# Patient Record
Sex: Male | Born: 1995 | Race: Black or African American | Hispanic: No | Marital: Single | Smoking: Current every day smoker
Health system: Southern US, Community
[De-identification: ages and names within clinical notes are randomized; demographics above are authoritative.]

---
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
## 2018-09-07 ENCOUNTER — Other Ambulatory Visit: Payer: Self-pay

## 2018-09-07 ENCOUNTER — Encounter (HOSPITAL_COMMUNITY): Payer: Self-pay | Admitting: Emergency Medicine

## 2018-09-07 ENCOUNTER — Emergency Department (HOSPITAL_COMMUNITY)
Admission: EM | Admit: 2018-09-07 | Discharge: 2018-09-07 | Disposition: A | Discharge status: Home / Self Care | Payer: No Typology Code available for payment source | Attending: Emergency Medicine | Admitting: Emergency Medicine

## 2018-09-07 ENCOUNTER — Emergency Department (HOSPITAL_COMMUNITY): Payer: No Typology Code available for payment source

## 2018-09-07 DIAGNOSIS — S199XXA Unspecified injury of neck, initial encounter: Secondary | ICD-10-CM

## 2018-09-07 DIAGNOSIS — S0990XA Unspecified injury of head, initial encounter: Secondary | ICD-10-CM | POA: Diagnosis not present

## 2018-09-07 DIAGNOSIS — Y9389 Activity, other specified: Secondary | ICD-10-CM | POA: Insufficient documentation

## 2018-09-07 DIAGNOSIS — Y999 Unspecified external cause status: Secondary | ICD-10-CM | POA: Diagnosis not present

## 2018-09-07 DIAGNOSIS — Y92410 Unspecified street and highway as the place of occurrence of the external cause: Secondary | ICD-10-CM | POA: Insufficient documentation
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
## 2018-09-07 NOTE — Discharge Instructions (Addendum)
The CT images did not show any serious problems.  You may continue to have some soreness of for a few days, which should improve by use of heat on the sore areas and taking Tylenol or Motrin.  Return here, if needed, for problems.
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
## 2018-09-15 ENCOUNTER — Encounter (HOSPITAL_COMMUNITY): Payer: Self-pay | Admitting: Emergency Medicine

## 2018-09-15 ENCOUNTER — Other Ambulatory Visit: Payer: Self-pay

## 2018-09-15 ENCOUNTER — Ambulatory Visit (HOSPITAL_COMMUNITY)
Admission: EM | Admit: 2018-09-15 | Discharge: 2018-09-15 | Disposition: A | Discharge status: Home / Self Care | Payer: Self-pay | Attending: Internal Medicine | Admitting: Internal Medicine

## 2018-09-15 DIAGNOSIS — F17210 Nicotine dependence, cigarettes, uncomplicated: Secondary | ICD-10-CM | POA: Insufficient documentation

## 2018-09-15 DIAGNOSIS — Z202 Contact with and (suspected) exposure to infections with a predominantly sexual mode of transmission: Secondary | ICD-10-CM | POA: Insufficient documentation

## 2018-09-15 NOTE — ED Triage Notes (Signed)
Pt states his significant other informed him 2-3 days ago that they tested positive for herpes.  Pt is here for testing.  He is not having any symptoms.

## 2018-09-15 NOTE — ED Provider Notes (Signed)
[facility]    CSN: [id number] Arrival date & time: 09/15/18  1358     History   Chief Complaint Chief Complaint  Patient presents with  . Exposure to STD    HPI Alan Warren is a 22 y.o. male.   Who present today requesting STD testing done due to his prior sexual partner testing + for herpes via blood but she did not tell him which type. He denies having any penile lesions of penile discharge of dysuria.      History reviewed. No pertinent past medical history.  There are no active problems to display for this patient.   History reviewed. No pertinent surgical history.   Home Medications    Prior to Admission medications   Not on File    Family History History reviewed. No pertinent family history.  Social History Social History   Tobacco Use  . Smoking status: Current Every Day Smoker    Packs/day: 0.25  . Smokeless tobacco: Never Used  Substance Use Topics  . Alcohol use: No  . Drug use: Not Currently     Allergies   Patient has no known allergies.   Review of Systems Review of Systems  Genitourinary: Negative for discharge, dysuria, genital sores, penile pain, penile swelling, scrotal swelling and urgency.  Skin: Negative for rash.     Physical Exam Triage Vital Signs ED Triage Vitals  Enc Vitals Group     BP 09/15/18 1528 128/78     Pulse Rate 09/15/18 1528 72     Resp --      Temp 09/15/18 1528 98.2 F (36.8 C)     Temp Source 09/15/18 1528 Oral     SpO2 09/15/18 1528 100 %     Weight --      Height --      Head Circumference --      Peak Flow --      Pain Score 09/15/18 1529 0     Pain Loc --      Pain Edu? --      Excl. in GC? --    No data found.  Updated Vital Signs BP 128/78 (BP Location: Right Arm)   Pulse 72   Temp 98.2 F (36.8 C) (Oral)   SpO2 100%   Visual Acuity Right Eye Distance:   Left Eye Distance:   Bilateral Distance:    Right Eye Near:   Left Eye Near:    Bilateral Near:      Physical Exam  Constitutional: He appears well-developed and well-nourished. No distress.  HENT:  Head: Normocephalic.  Right Ear: External ear normal.  Left Ear: External ear normal.  Nose: Nose normal.  Eyes: Conjunctivae are normal. No scleral icterus.  Neck: Neck supple.  Pulmonary/Chest: Effort normal.  Genitourinary: Penis normal.  Genitourinary Comments: He did not want his male friend to leave the room during his exam, instead of me bringing a chaperone. He was not exposed to his friend during the exam   Neurological: He is alert.  Skin: Skin is warm and dry. No rash noted. He is not diaphoretic.  Psychiatric: He has a normal mood and affect. His behavior is normal. Thought content normal.  Nursing note and vitals reviewed.    UC Treatments / Results  Labs (all labs ordered are listed, but only abnormal results are displayed) Labs Reviewed  HSV(HERPES SIMPLEX VRS) I + II AB-IGG  RPR  HIV ANTIBODY (ROUTINE TESTING W REFLEX)  URINE CYTOLOGY ANCILLARY ONLY  EKG None  Radiology No results found.  Procedures Procedures (including critical care time)  Medications Ordered in UC Medications - No data to display  Initial Impression / Assessment and Plan / UC Course  I have reviewed the triage vital signs and the nursing notes. STD panel ordered.  He was explained about blood Herpes testing and what it means. Asked to sign up with mychart so we can inform him about his results when back.  He has read about herpes already and does not have any questions      Final Clinical Impressions(s) / UC Diagnoses   Final diagnoses:  STD exposure   Discharge Instructions   None    ED Prescriptions    None     Controlled Substance Prescriptions [place] Controlled Substance Registry consulted?    Garey HamRodriguez-Southworth, Roisin Mones, PA-C 09/15/18 1601

## 2018-09-16 LAB — RPR: RPR Ser Ql: NONREACTIVE

## 2018-09-16 LAB — URINE CYTOLOGY ANCILLARY ONLY
Chlamydia: NEGATIVE
NEISSERIA GONORRHEA: NEGATIVE
TRICH (WINDOWPATH): NEGATIVE

## 2018-09-16 LAB — HIV ANTIBODY (ROUTINE TESTING W REFLEX): HIV Screen 4th Generation wRfx: NONREACTIVE

## 2018-09-16 LAB — HSV(HERPES SIMPLEX VRS) I + II AB-IGG: HSV 2 Glycoprotein G Ab, IgG: <0.91 index (ref 0.00–0.90)

## 2018-10-02 ENCOUNTER — Other Ambulatory Visit: Payer: Self-pay

## 2018-10-02 ENCOUNTER — Ambulatory Visit (HOSPITAL_COMMUNITY)
Admission: EM | Admit: 2018-10-02 | Discharge: 2018-10-02 | Disposition: A | Discharge status: Home / Self Care | Payer: Self-pay | Attending: Family Medicine | Admitting: Family Medicine

## 2018-10-02 ENCOUNTER — Encounter (HOSPITAL_COMMUNITY): Payer: Self-pay

## 2018-10-02 DIAGNOSIS — Z113 Encounter for screening for infections with a predominantly sexual mode of transmission: Secondary | ICD-10-CM

## 2018-10-02 DIAGNOSIS — F17210 Nicotine dependence, cigarettes, uncomplicated: Secondary | ICD-10-CM | POA: Insufficient documentation

## 2018-10-02 NOTE — ED Triage Notes (Signed)
Pt cc wants to be tested for STD's .

## 2018-10-02 NOTE — Discharge Instructions (Addendum)
We are testing you again today for STDs We will call you with any positive results

## 2018-10-02 NOTE — ED Provider Notes (Signed)
[facility]    CSN: [id number] Arrival date & time: 10/02/18  1620     History   Chief Complaint Chief Complaint  Patient presents with  . SEXUALLY TRANSMITTED DISEASE    HPI Carin Hockraves Standley is a 22 y.o. male.   Patient is a 22 year old male presents for STD screening.  He was here on 09/15/2018 where he was tested and all results were negative.  Reports that he has been reexposed and would like be tested again.  He denies any current symptoms. No penile pain, lesions, dysuria or penile discharge.   ROS per HPI      History reviewed. No pertinent past medical history.  There are no active problems to display for this patient.   History reviewed. No pertinent surgical history.     Home Medications    Prior to Admission medications   Not on File    Family History History reviewed. No pertinent family history.  Social History Social History   Tobacco Use  . Smoking status: Current Every Day Smoker    Packs/day: 0.25  . Smokeless tobacco: Never Used  Substance Use Topics  . Alcohol use: No  . Drug use: Not Currently     Allergies   Patient has no known allergies.   Review of Systems Review of Systems   Physical Exam Triage Vital Signs ED Triage Vitals  Enc Vitals Group     BP 10/02/18 1702 117/75     Pulse Rate 10/02/18 1702 72     Resp 10/02/18 1702 18     Temp 10/02/18 1702 98.5 F (36.9 C)     Temp src --      SpO2 10/02/18 1702 100 %     Weight 10/02/18 1700 175 lb (79.4 kg)     Height --      Head Circumference --      Peak Flow --      Pain Score 10/02/18 1735 0     Pain Loc --      Pain Edu? --      Excl. in GC? --    No data found.  Updated Vital Signs BP 117/75 (BP Location: Right Arm)   Pulse 72   Temp 98.5 F (36.9 C)   Resp 18   Wt 175 lb (79.4 kg)   SpO2 100%   Visual Acuity Right Eye Distance:   Left Eye Distance:   Bilateral Distance:    Right Eye Near:   Left Eye Near:    Bilateral Near:      Physical Exam Vitals signs and nursing note reviewed.  Constitutional:      Appearance: Normal appearance.  HENT:     Head: Normocephalic and atraumatic.  Eyes:     Conjunctiva/sclera: Conjunctivae normal.  Neck:     Musculoskeletal: Normal range of motion.  Pulmonary:     Effort: Pulmonary effort is normal.  Musculoskeletal: Normal range of motion.  Skin:    General: Skin is warm and dry.  Neurological:     Mental Status: He is alert.  Psychiatric:        Mood and Affect: Mood normal.      UC Treatments / Results  Labs (all labs ordered are listed, but only abnormal results are displayed) Labs Reviewed  URINE CYTOLOGY ANCILLARY ONLY    EKG None  Radiology No results found.  Procedures Procedures (including critical care time)  Medications Ordered in UC Medications - No data to display  Initial  Impression / Assessment and Plan / UC Course  I have reviewed the triage vital signs and the nursing notes.  Pertinent labs & imaging results that were available during my care of the patient were reviewed by me and considered in my medical decision making (see chart for details).     Urine sent for cytology No symptoms Will wait for results to treat if appropriate Lab results pending Final Clinical Impressions(s) / UC Diagnoses   Final diagnoses:  Screening examination for STD (sexually transmitted disease)     Discharge Instructions     We are testing you again today for STDs We will call you with any positive results    ED Prescriptions    None     Controlled Substance Prescriptions [place] Controlled Substance Registry consulted? Not Applicable   Janace ArisBast, Jiro Kiester A, NP 10/02/18 1742

## 2018-10-03 LAB — URINE CYTOLOGY ANCILLARY ONLY
CHLAMYDIA, DNA PROBE: NEGATIVE
NEISSERIA GONORRHEA: NEGATIVE
TRICH (WINDOWPATH): NEGATIVE

## 2019-05-20 ENCOUNTER — Emergency Department (HOSPITAL_COMMUNITY): Payer: Self-pay

## 2019-05-20 ENCOUNTER — Encounter (HOSPITAL_COMMUNITY): Payer: Self-pay | Admitting: Emergency Medicine

## 2019-05-20 ENCOUNTER — Other Ambulatory Visit: Payer: Self-pay

## 2019-05-20 ENCOUNTER — Emergency Department (HOSPITAL_COMMUNITY)
Admission: EM | Admit: 2019-05-20 | Discharge: 2019-05-20 | Disposition: A | Discharge status: Home / Self Care | Payer: Self-pay | Attending: Emergency Medicine | Admitting: Emergency Medicine

## 2019-05-20 DIAGNOSIS — W3400XA Accidental discharge from unspecified firearms or gun, initial encounter: Secondary | ICD-10-CM

## 2019-05-20 DIAGNOSIS — Z23 Encounter for immunization: Secondary | ICD-10-CM | POA: Insufficient documentation

## 2019-05-20 DIAGNOSIS — S81832A Puncture wound without foreign body, left lower leg, initial encounter: Secondary | ICD-10-CM | POA: Insufficient documentation

## 2019-05-20 DIAGNOSIS — Y999 Unspecified external cause status: Secondary | ICD-10-CM | POA: Insufficient documentation

## 2019-05-20 DIAGNOSIS — F17210 Nicotine dependence, cigarettes, uncomplicated: Secondary | ICD-10-CM | POA: Insufficient documentation

## 2019-05-20 DIAGNOSIS — Y929 Unspecified place or not applicable: Secondary | ICD-10-CM | POA: Insufficient documentation

## 2019-05-20 DIAGNOSIS — Y9389 Activity, other specified: Secondary | ICD-10-CM | POA: Insufficient documentation

## 2019-05-20 LAB — POCT I-STAT EG7
Acid-base deficit: 1 mmol/L (ref 0.0–2.0)
Bicarbonate: 23 mmol/L (ref 20.0–28.0)
Calcium, Ion: 1.09 mmol/L — ABNORMAL LOW (ref 1.15–1.40)
HCT: 46 % (ref 39.0–52.0)
Hemoglobin: 15.6 g/dL (ref 13.0–17.0)
O2 Saturation: 69 %
Potassium: 3.7 mmol/L (ref 3.5–5.1)
Sodium: 141 mmol/L (ref 135–145)
TCO2: 24 mmol/L (ref 22–32)
pCO2, Ven: 36.3 mmHg — ABNORMAL LOW (ref 44.0–60.0)
pH, Ven: 7.41 (ref 7.250–7.430)
pO2, Ven: 35 mmHg (ref 32.0–45.0)

## 2019-05-20 LAB — I-STAT CREATININE, ED: Creatinine, Ser: 1.1 mg/dL (ref 0.61–1.24)

## 2019-05-20 MED ORDER — IBUPROFEN 600 MG PO TABS: 600.0000 mg | ORAL_TABLET | Freq: Four times a day (QID) | ORAL | 0 refills | Status: DC | PRN

## 2019-05-20 MED ORDER — OXYCODONE-ACETAMINOPHEN 5-325 MG PO TABS
1.0000 | ORAL_TABLET | Freq: Once | ORAL | Status: AC
  Administered 2019-05-20: 1 via ORAL
  Filled 2019-05-20: qty 1

## 2019-05-20 MED ORDER — ONDANSETRON HCL 4 MG/2ML IJ SOLN
4.0000 mg | Freq: Once | INTRAMUSCULAR | Status: AC
  Administered 2019-05-20: 4 mg via INTRAVENOUS
  Filled 2019-05-20: qty 2

## 2019-05-20 MED ORDER — IOHEXOL 350 MG/ML SOLN
100.0000 mL | Freq: Once | INTRAVENOUS | Status: AC | PRN
  Administered 2019-05-20: 100 mL via INTRAVENOUS

## 2019-05-20 MED ORDER — MORPHINE SULFATE (PF) 4 MG/ML IV SOLN
4.0000 mg | Freq: Once | INTRAVENOUS | Status: AC
  Administered 2019-05-20: 4 mg via INTRAVENOUS
  Filled 2019-05-20: qty 1

## 2019-05-20 MED ORDER — TETANUS-DIPHTH-ACELL PERTUSSIS 5-2.5-18.5 LF-MCG/0.5 IM SUSP
0.5000 mL | Freq: Once | INTRAMUSCULAR | Status: AC
  Administered 2019-05-20: 0.5 mL via INTRAMUSCULAR
  Filled 2019-05-20: qty 0.5

## 2019-05-20 MED ORDER — CEFAZOLIN SODIUM-DEXTROSE 2-4 GM/100ML-% IV SOLN
2.0000 g | Freq: Once | INTRAVENOUS | Status: AC
  Administered 2019-05-20: 2 g via INTRAVENOUS
  Filled 2019-05-20: qty 100

## 2019-05-20 MED ORDER — HYDROCODONE-ACETAMINOPHEN 5-325 MG PO TABS: 1.0000 | ORAL_TABLET | ORAL | 0 refills | Status: DC | PRN

## 2019-05-20 MED ORDER — CEPHALEXIN 500 MG PO CAPS: 500.0000 mg | ORAL_CAPSULE | Freq: Four times a day (QID) | ORAL | 0 refills | Status: AC

## 2019-05-20 NOTE — Discharge Instructions (Addendum)
Take Keflex as prescribed to prevent infection. Change your dressing at least 1-2 times per day to keep the area clean and dry.  Take ibuprofen for management of pain.  Continue to elevate and ice your wounds.  Use crutches to prevent from putting weight on your left leg over the next few days.  For severe pain, you may take Norco as prescribed.  Do not drive or drink alcohol after taking this medication as it may make you drowsy and impair your judgment.  You may return to the ED for any new or concerning symptoms.
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
## 2019-05-20 NOTE — ED Notes (Signed)
ED PA at bedside
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
## 2019-05-20 NOTE — ED Notes (Signed)
Wound redressed: 2x2 and wrapped

## 2020-05-05 ENCOUNTER — Other Ambulatory Visit: Payer: Self-pay

## 2020-05-05 ENCOUNTER — Encounter (HOSPITAL_COMMUNITY): Payer: Self-pay

## 2020-05-05 ENCOUNTER — Ambulatory Visit (HOSPITAL_COMMUNITY)
Admission: EM | Admit: 2020-05-05 | Discharge: 2020-05-05 | Disposition: A | Discharge status: Home / Self Care | Payer: BC Managed Care – PPO | Attending: Family Medicine | Admitting: Family Medicine

## 2020-05-05 ENCOUNTER — Ambulatory Visit (INDEPENDENT_AMBULATORY_CARE_PROVIDER_SITE_OTHER): Payer: BC Managed Care – PPO

## 2020-05-05 DIAGNOSIS — M79672 Pain in left foot: Secondary | ICD-10-CM

## 2020-05-05 DIAGNOSIS — R2242 Localized swelling, mass and lump, left lower limb: Secondary | ICD-10-CM

## 2020-05-05 MED ORDER — PREDNISONE 10 MG (21) PO TBPK: ORAL_TABLET | ORAL | 0 refills | Status: DC

## 2020-05-05 NOTE — ED Triage Notes (Signed)
Pt c/o pain to bottom of left foot x 3 days, denies injury

## 2020-05-05 NOTE — ED Provider Notes (Signed)
[facility]    CSN: [id number] Arrival date & time: 05/05/20  0807      History   Chief Complaint Chief Complaint  Patient presents with  . Foot Pain    HPI Alan Warren is a 24 y.o. male.   Pt is a 24 year old male that presents with left foot pain.  This is been present and worsening for 3 days.  The pain is more medial to the base of the great toe.  There is swelling and mild erythema.  No known history of gout.  No injuries to the foot.  Stands on feet for long hours at work wearing steel toed boots.  Has not take anything for the pain.  Pain is worse with ambulation.  Reporting dad has history of gout.  No fevers, chills.  ROS per HPI      History reviewed. No pertinent past medical history.  There are no problems to display for this patient.   History reviewed. No pertinent surgical history.     Home Medications    Prior to Admission medications   Medication Sig Start Date End Date Taking? Authorizing Provider  predniSONE (STERAPRED UNI-PAK 21 TAB) 10 MG (21) TBPK tablet 6 tabs for 1 day, then 5 tabs for 1 das, then 4 tabs for 1 day, then 3 tabs for 1 day, 2 tabs for 1 day, then 1 tab for 1 day 05/05/20   Janace Aris, NP    Family History Family History  Problem Relation Age of Onset  . Healthy Father     Social History Social History   Tobacco Use  . Smoking status: Current Every Day Smoker    Packs/day: 0.25  . Smokeless tobacco: Never Used  Vaping Use  . Vaping Use: Never used  Substance Use Topics  . Alcohol use: No  . Drug use: Not Currently     Allergies   Patient has no known allergies.   Review of Systems Review of Systems   Physical Exam Triage Vital Signs ED Triage Vitals  Enc Vitals Group     BP 05/05/20 0823 130/75     Pulse Rate 05/05/20 0822 67     Resp 05/05/20 0822 18     Temp 05/05/20 0822 98 F (36.7 C)     Temp src --      SpO2 05/05/20 0822 97 %     Weight --      Height --      Head  Circumference --      Peak Flow --      Pain Score 05/05/20 0823 8     Pain Loc --      Pain Edu? --      Excl. in GC? --    No data found.  Updated Vital Signs BP 130/75   Pulse 67   Temp 98 F (36.7 C)   Resp 18   SpO2 97%   Visual Acuity Right Eye Distance:   Left Eye Distance:   Bilateral Distance:    Right Eye Near:   Left Eye Near:    Bilateral Near:     Physical Exam Vitals and nursing note reviewed.  Constitutional:      Appearance: Normal appearance.  HENT:     Head: Normocephalic and atraumatic.     Nose: Nose normal.  Eyes:     Conjunctiva/sclera: Conjunctivae normal.  Pulmonary:     Effort: Pulmonary effort is normal.  Musculoskeletal:  General: Normal range of motion.     Cervical back: Normal range of motion.       Feet:     Comments: TTP, erythema, swelling  Skin:    General: Skin is warm and dry.  Neurological:     Mental Status: He is alert.  Psychiatric:        Mood and Affect: Mood normal.      UC Treatments / Results  Labs (all labs ordered are listed, but only abnormal results are displayed) Labs Reviewed - No data to display  EKG   Radiology DG Foot Complete Left  Result Date: 05/05/2020 CLINICAL DATA:  Foot pain and swelling, no known injury, initial encounter EXAM: LEFT FOOT - COMPLETE 3+ VIEW COMPARISON:  None. FINDINGS: No acute fracture or dislocation is noted. Mild soft tissue swelling is noted at the first MTP joint. No definitive gouty tophi are seen. No other focal abnormality is noted. IMPRESSION: Mild soft tissue swelling without acute bony abnormality. Electronically Signed   By: Alcide Clever M.D.   On: 05/05/2020 09:04    Procedures Procedures (including critical care time)  Medications Ordered in UC Medications - No data to display  Initial Impression / Assessment and Plan / UC Course  I have reviewed the triage vital signs and the nursing notes.  Pertinent labs & imaging results that were available  during my care of the patient were reviewed by me and considered in my medical decision making (see chart for details).     Left foot pain X-ray negative for any acute fracture or abnormalities. Treating for gout versus arthritis Prednisone taper over the next 6 days.  Rest, ice, elevate. Follow up as needed for continued or worsening symptoms  Final Clinical Impressions(s) / UC Diagnoses   Final diagnoses:  Foot pain, left     Discharge Instructions     Believe this is possible gout or arthritis flare.  We are going to do a prednisone taper over the next 6 days. Rest, ice, elevate the foot. Follow up as needed for continued or worsening symptoms     ED Prescriptions    Medication Sig Dispense Auth. Provider   predniSONE (STERAPRED UNI-PAK 21 TAB) 10 MG (21) TBPK tablet 6 tabs for 1 day, then 5 tabs for 1 das, then 4 tabs for 1 day, then 3 tabs for 1 day, 2 tabs for 1 day, then 1 tab for 1 day 21 tablet Eileen Kangas A, NP     PDMP not reviewed this encounter.   Janace Aris, NP 05/05/20 1240

## 2020-05-05 NOTE — Discharge Instructions (Signed)
Believe this is possible gout or arthritis flare.  We are going to do a prednisone taper over the next 6 days. Rest, ice, elevate the foot. Follow up as needed for continued or worsening symptoms
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
## 2021-05-04 ENCOUNTER — Encounter (HOSPITAL_COMMUNITY): Payer: Self-pay | Admitting: Emergency Medicine

## 2021-05-04 ENCOUNTER — Ambulatory Visit (HOSPITAL_COMMUNITY)
Admission: EM | Admit: 2021-05-04 | Discharge: 2021-05-04 | Disposition: A | Discharge status: Home / Self Care | Payer: Self-pay | Attending: Internal Medicine | Admitting: Internal Medicine

## 2021-05-04 ENCOUNTER — Other Ambulatory Visit: Payer: Self-pay

## 2021-05-04 DIAGNOSIS — Z20822 Contact with and (suspected) exposure to covid-19: Secondary | ICD-10-CM | POA: Insufficient documentation

## 2021-05-04 DIAGNOSIS — J029 Acute pharyngitis, unspecified: Secondary | ICD-10-CM

## 2021-05-04 DIAGNOSIS — H1033 Unspecified acute conjunctivitis, bilateral: Secondary | ICD-10-CM

## 2021-05-04 DIAGNOSIS — F17210 Nicotine dependence, cigarettes, uncomplicated: Secondary | ICD-10-CM | POA: Insufficient documentation

## 2021-05-04 DIAGNOSIS — Z79899 Other long term (current) drug therapy: Secondary | ICD-10-CM | POA: Insufficient documentation

## 2021-05-04 LAB — POCT INFECTIOUS MONO SCREEN, ED / UC: Mono Screen: NEGATIVE

## 2021-05-04 LAB — POCT RAPID STREP A, ED / UC
Streptococcus, Group A Screen (Direct): NEGATIVE
Streptococcus, Group A Screen (Direct): NEGATIVE

## 2021-05-04 LAB — SARS CORONAVIRUS 2 (TAT 6-24 HRS): SARS Coronavirus 2: NEGATIVE

## 2021-05-04 MED ORDER — ERYTHROMYCIN 5 MG/GM OP OINT: TOPICAL_OINTMENT | OPHTHALMIC | 0 refills | Status: DC

## 2021-05-04 NOTE — ED Provider Notes (Signed)
[facility]    CSN: [id number] Arrival date & time: 05/04/21  1125      History   Chief Complaint Chief Complaint  Patient presents with   Eye Problem   Sore Throat    HPI Alan Warren is a 25 y.o. male.   Patient presents with 4-day history of sore throat and bilateral eye redness and watering.  Denies any known fevers or known sick contacts at home.  Denies any nasal congestion, nausea, vomiting, diarrhea.  Patient has not yet taken any over-the-counter medications to alleviate his symptoms.  Denies any blurry vision.  Also states that he has noticed green purulent discharge coming from eyes in the morning.  Denies eye pain or itchiness.   Eye Problem Sore Throat   History reviewed. No pertinent past medical history.  There are no problems to display for this patient.   History reviewed. No pertinent surgical history.     Home Medications    Prior to Admission medications   Medication Sig Start Date End Date Taking? Authorizing Provider  erythromycin ophthalmic ointment Place a 1/2 inch ribbon of ointment into the lower eyelid 4 times daily for 7 days. 05/04/21  Yes Lance Muss, FNP  predniSONE (STERAPRED UNI-PAK 21 TAB) 10 MG (21) TBPK tablet 6 tabs for 1 day, then 5 tabs for 1 das, then 4 tabs for 1 day, then 3 tabs for 1 day, 2 tabs for 1 day, then 1 tab for 1 day Patient not taking: Reported on 05/04/2021 05/05/20   Janace Aris, NP    Family History Family History  Problem Relation Age of Onset   Healthy Father     Social History Social History   Tobacco Use   Smoking status: Every Day    Packs/day: 0.25    Types: Cigarettes   Smokeless tobacco: Never  Vaping Use   Vaping Use: Never used  Substance Use Topics   Alcohol use: No   Drug use: Not Currently     Allergies   Patient has no known allergies.   Review of Systems Review of Systems Per HPI  Physical Exam Triage Vital Signs ED Triage Vitals  Enc Vitals Group     BP  05/04/21 1154 117/67     Pulse Rate 05/04/21 1154 63     Resp 05/04/21 1154 18     Temp 05/04/21 1154 98.5 F (36.9 C)     Temp Source 05/04/21 1154 Oral     SpO2 05/04/21 1154 100 %     Weight --      Height --      Head Circumference --      Peak Flow --      Pain Score 05/04/21 1151 0     Pain Loc --      Pain Edu? --      Excl. in GC? --    No data found.  Updated Vital Signs BP 117/67 (BP Location: Left Arm)   Pulse 63   Temp 98.5 F (36.9 C) (Oral)   Resp 18   SpO2 100%   Visual Acuity Right Eye Distance: 20/20 Left Eye Distance: 20/20 Bilateral Distance: 20/15  Right Eye Near:   Left Eye Near:    Bilateral Near:     Physical Exam Constitutional:      General: He is not in acute distress.    Appearance: Normal appearance.  HENT:     Head: Normocephalic and atraumatic.     Right  Ear: Tympanic membrane and ear canal normal.     Left Ear: Tympanic membrane and ear canal normal.     Nose: No mucosal edema, congestion or rhinorrhea.     Mouth/Throat:     Pharynx: Posterior oropharyngeal erythema present.  Eyes:     General: Lids are normal.     Extraocular Movements: Extraocular movements intact.     Conjunctiva/sclera:     Right eye: Right conjunctiva is injected.     Left eye: Left conjunctiva is injected.     Comments: Swollen and erythematous conjunctiva.  Cardiovascular:     Rate and Rhythm: Normal rate and regular rhythm.     Pulses: Normal pulses.     Heart sounds: Normal heart sounds.  Pulmonary:     Effort: Pulmonary effort is normal. No respiratory distress.     Breath sounds: Normal breath sounds. No wheezing, rhonchi or rales.  Abdominal:     General: Abdomen is flat. Bowel sounds are normal.     Palpations: Abdomen is soft.  Skin:    General: Skin is warm and dry.  Neurological:     General: No focal deficit present.     Mental Status: He is alert and oriented to person, place, and time. Mental status is at baseline.  Psychiatric:         Mood and Affect: Mood normal.        Behavior: Behavior normal.        Thought Content: Thought content normal.        Judgment: Judgment normal.     UC Treatments / Results  Labs (all labs ordered are listed, but only abnormal results are displayed) Labs Reviewed  CULTURE, GROUP A STREP ([facility])  SARS CORONAVIRUS 2 (TAT 6-24 HRS)  POCT RAPID STREP A, ED / UC  POCT RAPID STREP A, ED / UC  POCT INFECTIOUS MONO SCREEN, ED / UC    EKG   Radiology No results found.  Procedures Procedures (including critical care time)  Medications Ordered in UC Medications - No data to display  Initial Impression / Assessment and Plan / UC Course  I have reviewed the triage vital signs and the nursing notes.  Pertinent labs & imaging results that were available during my care of the patient were reviewed by me and considered in my medical decision making (see chart for details).     Rapid strep test and mononucleosis test was negative in urgent care today.  Throat culture and COVID-19 viral swab is pending.  Suspect viral cause to symptoms due to physical exam.  Discussed over-the-counter options for symptom treatment with patient.  Patient to follow-up if symptoms do not improve or if they worsen. Discussed strict return precautions. Patient verbalized understanding and is agreeable with plan.  Final Clinical Impressions(s) / UC Diagnoses   Final diagnoses:  Acute bacterial conjunctivitis of both eyes  Pharyngitis, unspecified etiology     Discharge Instructions      You are being treated for bilateral conjunctivitis (pink eye) them erythromycin ointment.  Please change pillowcase daily to prevent it spreading to other people.  Your rapid strep test was negative in urgent care today.  Your mononucleosis test was also negative. Throat culture is pending.  COVID-19 viral swab is also pending.  We will call if either of these are positive.  You are likely experiencing a viral infection  that is causing sore throat.  Please try the below medications to alleviate symptoms.  You likely having a  viral upper respiratory infection. We recommended symptom control. I expect your symptoms to start improving in the next 1-2 weeks.   1. Take a daily allergy pill/anti-histamine like Zyrtec, Claritin, or Store brand consistently for 2 weeks  2. For congestion you may try an oral decongestant like Mucinex or sudafed. You may also try intranasal flonase nasal spray or saline irrigations (neti pot, sinus cleanse)  3. For your sore throat you may try cepacol lozenges, salt water gargles, throat spray. Treatment of congestion may also help your sore throat.  4. For cough you may try Robitussen, Mucinex DM  5. Take Tylenol or Ibuprofen to help with pain/inflammation  6. Stay hydrated, drink plenty of fluids to keep throat coated and less irritated  Honey Tea For cough/sore throat try using a honey-based tea. Use 3 teaspoons of honey with juice squeezed from half lemon. Place shaved pieces of ginger into 1/2-1 cup of water and warm over stove top. Then mix the ingredients and repeat every 4 hours as needed.   Please follow-up if symptoms do not improve.     ED Prescriptions     Medication Sig Dispense Auth. Provider   erythromycin ophthalmic ointment Place a 1/2 inch ribbon of ointment into the lower eyelid 4 times daily for 7 days. 3.5 g Lance Muss, FNP      PDMP not reviewed this encounter.   Lance Muss, FNP 05/04/21 1312

## 2021-05-04 NOTE — Discharge Instructions (Addendum)
You are being treated for bilateral conjunctivitis (pink eye) them erythromycin ointment.  Please change pillowcase daily to prevent it spreading to other people.  Your rapid strep test was negative in urgent care today.  Your mononucleosis test was also negative. Throat culture is pending.  COVID-19 viral swab is also pending.  We will call if either of these are positive.  You are likely experiencing a viral infection that is causing sore throat.  Please try the below medications to alleviate symptoms.  You likely having a viral upper respiratory infection. We recommended symptom control. I expect your symptoms to start improving in the next 1-2 weeks.   1. Take a daily allergy pill/anti-histamine like Zyrtec, Claritin, or Store brand consistently for 2 weeks  2. For congestion you may try an oral decongestant like Mucinex or sudafed. You may also try intranasal flonase nasal spray or saline irrigations (neti pot, sinus cleanse)  3. For your sore throat you may try cepacol lozenges, salt water gargles, throat spray. Treatment of congestion may also help your sore throat.  4. For cough you may try Robitussen, Mucinex DM  5. Take Tylenol or Ibuprofen to help with pain/inflammation  6. Stay hydrated, drink plenty of fluids to keep throat coated and less irritated  Honey Tea For cough/sore throat try using a honey-based tea. Use 3 teaspoons of honey with juice squeezed from half lemon. Place shaved pieces of ginger into 1/2-1 cup of water and warm over stove top. Then mix the ingredients and repeat every 4 hours as needed.   Please follow-up if symptoms do not improve.

## 2021-05-04 NOTE — ED Triage Notes (Signed)
Sore throat and bilateral eye redness.  Denies eye pain.  Eyes water, drainage in morning.  No change in vision

## 2021-05-06 LAB — CULTURE, GROUP A STREP (THRC)

## 2022-06-21 ENCOUNTER — Ambulatory Visit: Payer: Self-pay

## 2022-06-21 ENCOUNTER — Ambulatory Visit (HOSPITAL_COMMUNITY)
Admission: RE | Admit: 2022-06-21 | Discharge: 2022-06-21 | Disposition: A | Discharge status: Home / Self Care | Payer: Self-pay | Source: Ambulatory Visit | Attending: Family Medicine | Admitting: Family Medicine

## 2022-06-21 ENCOUNTER — Encounter (HOSPITAL_COMMUNITY): Payer: Self-pay

## 2022-06-21 VITALS — BP 144/84 | HR 72 | Temp 98.4°F | Resp 20

## 2022-06-21 DIAGNOSIS — Z113 Encounter for screening for infections with a predominantly sexual mode of transmission: Secondary | ICD-10-CM | POA: Insufficient documentation

## 2022-06-21 DIAGNOSIS — L03116 Cellulitis of left lower limb: Secondary | ICD-10-CM | POA: Insufficient documentation

## 2022-06-21 LAB — HIV ANTIBODY (ROUTINE TESTING W REFLEX): HIV Screen 4th Generation wRfx: NONREACTIVE

## 2022-06-21 MED ORDER — AMOXICILLIN-POT CLAVULANATE 875-125 MG PO TABS: 1.0000 | ORAL_TABLET | Freq: Two times a day (BID) | ORAL | 0 refills | Status: AC

## 2022-06-21 NOTE — ED Provider Notes (Signed)
[facility]    CSN: [id number] Arrival date & time: 06/21/22  1830      History   Chief Complaint Chief Complaint  Patient presents with   SEXUALLY TRANSMITTED DISEASE    I need testing for all Std please - Entered by patient    HPI Alan Warren is a 26 y.o. male.   HPI Here for screening for sexually transmitted diseases.  He is not having any penile discharge or dysuria or abdominal pain or fever or itching.  He does also request blood work to check HIV and syphilis.  He does note a bump on his proximal thigh on the medial side near his inguinal crease.  Its been there couple of days.  It is mildly tender when touched History reviewed. No pertinent past medical history.  There are no problems to display for this patient.   History reviewed. No pertinent surgical history.     Home Medications    Prior to Admission medications   Medication Sig Start Date End Date Taking? Authorizing Provider  amoxicillin-clavulanate (AUGMENTIN) 875-125 MG tablet Take 1 tablet by mouth 2 (two) times daily for 7 days. 06/21/22 06/28/22 Yes Blessed Girdner, Janace Aris, MD    Family History Family History  Problem Relation Age of Onset   Healthy Father     Social History Social History   Tobacco Use   Smoking status: Every Day    Packs/day: 0.25    Types: Cigarettes   Smokeless tobacco: Never  Vaping Use   Vaping Use: Never used  Substance Use Topics   Alcohol use: No   Drug use: Not Currently     Allergies   Patient has no known allergies.   Review of Systems Review of Systems   Physical Exam Triage Vital Signs ED Triage Vitals [06/21/22 1849]  Enc Vitals Group     BP (!) 144/84     Pulse Rate 72     Resp 20     Temp 98.4 F (36.9 C)     Temp Source Oral     SpO2 98 %     Weight      Height      Head Circumference      Peak Flow      Pain Score      Pain Loc      Pain Edu?      Excl. in GC?    No data found.  Updated Vital Signs BP (!) 144/84 (BP  Location: Left Arm)   Pulse 72   Temp 98.4 F (36.9 C) (Oral)   Resp 20   SpO2 98%   Visual Acuity Right Eye Distance:   Left Eye Distance:   Bilateral Distance:    Right Eye Near:   Left Eye Near:    Bilateral Near:     Physical Exam Vitals reviewed.  Constitutional:      General: He is not in acute distress.    Appearance: He is not ill-appearing, toxic-appearing or diaphoretic.  HENT:     Mouth/Throat:     Mouth: Mucous membranes are moist.  Cardiovascular:     Rate and Rhythm: Normal rate and regular rhythm.  Pulmonary:     Effort: Pulmonary effort is normal.     Breath sounds: Normal breath sounds.  Skin:    Coloration: Skin is not pale.     Comments: There is an area of induration about 2 cm in diameter on his medial proximal thigh near the inguinal  crease.  There is mild erythema and mild redness.  There is no fluctuance.  Neurological:     General: No focal deficit present.     Mental Status: He is alert and oriented to person, place, and time.  Psychiatric:        Behavior: Behavior normal.      UC Treatments / Results  Labs (all labs ordered are listed, but only abnormal results are displayed) Labs Reviewed  HIV ANTIBODY (ROUTINE TESTING W REFLEX)  RPR  CYTOLOGY, (ORAL, ANAL, URETHRAL) ANCILLARY ONLY    EKG   Radiology No results found.  Procedures Procedures (including critical care time)  Medications Ordered in UC Medications - No data to display  Initial Impression / Assessment and Plan / UC Course  I have reviewed the triage vital signs and the nursing notes.  Pertinent labs & imaging results that were available during my care of the patient were reviewed by me and considered in my medical decision making (see chart for details).     Staff will notify him of any positives on his labs.  He is already using barrier protection, but he is still given some extra education on safe sex practices.  Antibiotics sent in for the  cellulitis/folliculitis on his thigh.      Final Clinical Impressions(s) / UC Diagnoses   Final diagnoses:  Screening examination for STD (sexually transmitted disease)  Cellulitis of left lower extremity     Discharge Instructions      Staff will notify you of any positives on your swab or blood work.  Take amoxicillin-clavulanate 875 mg--1 tab twice daily with food for 7 days; this is for skin infection on your leg        ED Prescriptions     Medication Sig Dispense Auth. Provider   amoxicillin-clavulanate (AUGMENTIN) 875-125 MG tablet Take 1 tablet by mouth 2 (two) times daily for 7 days. 14 tablet Evanny Ellerbe, Janace Aris, MD      PDMP not reviewed this encounter.   Zenia Resides, MD 06/21/22 [phone]

## 2022-06-21 NOTE — ED Triage Notes (Signed)
Pt present to office for STD testing. Pt denies any symptoms at this time.

## 2022-06-21 NOTE — Discharge Instructions (Addendum)
Staff will notify you of any positives on your swab or blood work.  Take amoxicillin-clavulanate 875 mg--1 tab twice daily with food for 7 days; this is for skin infection on your leg

## 2022-06-22 LAB — CYTOLOGY, (ORAL, ANAL, URETHRAL) ANCILLARY ONLY
Chlamydia: NEGATIVE
Comment: NEGATIVE
Comment: NEGATIVE
Comment: NORMAL
Neisseria Gonorrhea: NEGATIVE
Trichomonas: NEGATIVE

## 2022-06-22 LAB — RPR: RPR Ser Ql: NONREACTIVE

## 2023-02-14 ENCOUNTER — Encounter (HOSPITAL_COMMUNITY): Payer: Self-pay

## 2023-02-14 ENCOUNTER — Ambulatory Visit (HOSPITAL_COMMUNITY)
Admission: EM | Admit: 2023-02-14 | Discharge: 2023-02-14 | Disposition: A | Discharge status: Home / Self Care | Payer: Self-pay | Attending: Urgent Care | Admitting: Urgent Care

## 2023-02-14 DIAGNOSIS — S161XXA Strain of muscle, fascia and tendon at neck level, initial encounter: Secondary | ICD-10-CM

## 2023-02-14 DIAGNOSIS — M542 Cervicalgia: Secondary | ICD-10-CM

## 2023-02-14 MED ORDER — TIZANIDINE HCL 4 MG PO TABS: 4.0000 mg | ORAL_TABLET | Freq: Three times a day (TID) | ORAL | 0 refills | Status: AC | PRN

## 2023-02-14 MED ORDER — DICLOFENAC SODIUM 75 MG PO TBEC: 75.0000 mg | DELAYED_RELEASE_TABLET | Freq: Two times a day (BID) | ORAL | 0 refills | Status: AC

## 2023-02-14 MED ORDER — TIZANIDINE HCL 4 MG PO CAPS: 4.0000 mg | ORAL_CAPSULE | Freq: Three times a day (TID) | ORAL | 0 refills | Status: DC

## 2023-02-14 NOTE — Discharge Instructions (Addendum)
Please take the diclofenac twice daily with food.  Do not take any additional over-the-counter anti-inflammatory medication such as ibuprofen, Aleve, naproxen.  You may take the tizanidine, a muscle relaxer, up to 3 times a day.  Use with caution as it may make you feel drowsy.  Do not drive a car or operate machinery after taking.  Please take a warm Epsom salt bath this evening as this will help relax your muscles.  If you develop any worsening pain, or numbness and tingling in extremity, please head to the emergency room.
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
## 2023-06-16 ENCOUNTER — Ambulatory Visit (HOSPITAL_COMMUNITY)
Admission: EM | Admit: 2023-06-16 | Discharge: 2023-06-16 | Disposition: A | Discharge status: Home / Self Care | Payer: Commercial Managed Care - HMO | Attending: Emergency Medicine | Admitting: Emergency Medicine

## 2023-06-16 ENCOUNTER — Encounter (HOSPITAL_COMMUNITY): Payer: Self-pay

## 2023-06-16 DIAGNOSIS — Z113 Encounter for screening for infections with a predominantly sexual mode of transmission: Secondary | ICD-10-CM | POA: Diagnosis present

## 2023-06-16 LAB — HIV ANTIBODY (ROUTINE TESTING W REFLEX): HIV Screen 4th Generation wRfx: NONREACTIVE

## 2023-06-16 NOTE — ED Provider Notes (Signed)
[facility]    CSN: [id number] Arrival date & time: 06/16/23  1006      History   Chief Complaint Chief Complaint  Patient presents with   SEXUALLY TRANSMITTED DISEASE    HPI Alan Warren is a 27 y.o. male.   Patient presents to clinic requesting screening for sexually transmitted infections.  He denies any symptoms.  Denies dysuria, penile discharge, penile sores or lesions.  Denies any known exposures to sexually transmitted infections.  He would like to be screened for HIV and syphilis as well.  The history is provided by the patient and medical records.    History reviewed. No pertinent past medical history.  There are no problems to display for this patient.   History reviewed. No pertinent surgical history.     Home Medications    Prior to Admission medications   Medication Sig Start Date End Date Taking? Authorizing Provider  diclofenac (VOLTAREN) 75 MG EC tablet Take 1 tablet (75 mg total) by mouth 2 (two) times daily with a meal. 02/14/23   Crain, Whitney L, PA  tiZANidine (ZANAFLEX) 4 MG tablet Take 1 tablet (4 mg total) by mouth every 8 (eight) hours as needed for muscle spasms. 02/14/23   Maretta Bees, PA    Family History Family History  Problem Relation Age of Onset   Healthy Father     Social History Social History   Tobacco Use   Smoking status: Every Day    Current packs/day: 0.25    Types: Cigarettes   Smokeless tobacco: Never  Vaping Use   Vaping status: Never Used  Substance Use Topics   Alcohol use: No   Drug use: Not Currently     Allergies   Patient has no known allergies.   Review of Systems Review of Systems  Constitutional:  Negative for fever.  Genitourinary:  Negative for decreased urine volume, dysuria, flank pain, genital sores, penile discharge, penile pain, penile swelling, testicular pain and urgency.     Physical Exam Triage Vital Signs ED Triage Vitals  Encounter Vitals Group     BP 06/16/23  1051 (!) 141/82     Systolic BP Percentile --      Diastolic BP Percentile --      Pulse Rate 06/16/23 1051 82     Resp 06/16/23 1051 16     Temp 06/16/23 1051 98.7 F (37.1 C)     Temp Source 06/16/23 1051 Oral     SpO2 06/16/23 1051 98 %     Weight 06/16/23 1051 185 lb (83.9 kg)     Height 06/16/23 1051 6\' 1"  (1.854 m)     Head Circumference --      Peak Flow --      Pain Score 06/16/23 1050 0     Pain Loc --      Pain Education --      Exclude from Growth Chart --    No data found.  Updated Vital Signs BP (!) 141/82 (BP Location: Left Arm)   Pulse 82   Temp 98.7 F (37.1 C) (Oral)   Resp 16   Ht 6\' 1"  (1.854 m)   Wt 185 lb (83.9 kg)   SpO2 98%   BMI 24.41 kg/m   Visual Acuity Right Eye Distance:   Left Eye Distance:   Bilateral Distance:    Right Eye Near:   Left Eye Near:    Bilateral Near:     Physical Exam Vitals and nursing  note reviewed.  Constitutional:      Appearance: Normal appearance.  HENT:     Head: Normocephalic and atraumatic.     Right Ear: External ear normal.     Left Ear: External ear normal.     Nose: Nose normal.     Mouth/Throat:     Mouth: Mucous membranes are moist.  Eyes:     Conjunctiva/sclera: Conjunctivae normal.  Cardiovascular:     Rate and Rhythm: Normal rate.  Pulmonary:     Effort: Pulmonary effort is normal. No respiratory distress.  Musculoskeletal:        General: Normal range of motion.  Skin:    General: Skin is warm and dry.  Neurological:     General: No focal deficit present.     Mental Status: He is alert.  Psychiatric:        Mood and Affect: Mood normal.      UC Treatments / Results  Labs (all labs ordered are listed, but only abnormal results are displayed) Labs Reviewed  RPR  HIV ANTIBODY (ROUTINE TESTING W REFLEX)  CYTOLOGY, (ORAL, ANAL, URETHRAL) ANCILLARY ONLY    EKG   Radiology No results found.  Procedures Procedures (including critical care time)  Medications Ordered in  UC Medications - No data to display  Initial Impression / Assessment and Plan / UC Course  I have reviewed the triage vital signs and the nursing notes.  Pertinent labs & imaging results that were available during my care of the patient were reviewed by me and considered in my medical decision making (see chart for details).  Vitals and triage reviewed, patient is hemodynamically stable.  Asymptomatic STI screening, no known exposures.  Cytology and lab work obtained.  Staff to contact if treatment is indicated.  Plan of care, follow-up care and return precautions given, no questions at this time.     Final Clinical Impressions(s) / UC Diagnoses   Final diagnoses:  Screening examination for sexually transmitted disease     Discharge Instructions      We have screened you for sexually transmitted infections today and our staff will reach out if you test positive for anything.  Please abstain from intercourse until all results are received.  If you do test positive please notify any sexual partners so they can also receive treatment.  Return to clinic for new or urgent symptoms.    ED Prescriptions   None    PDMP not reviewed this encounter.   Fayette Ham[place], [place], [place] 06/16/23 [phone]

## 2023-06-16 NOTE — ED Triage Notes (Signed)
Patient here today to be tested for all STDs.

## 2023-06-16 NOTE — Discharge Instructions (Addendum)
We have screened you for sexually transmitted infections today and our staff will reach out if you test positive for anything.  Please abstain from intercourse until all results are received.  If you do test positive please notify any sexual partners so they can also receive treatment.  Return to clinic for new or urgent symptoms.

## 2023-06-17 LAB — RPR: RPR Ser Ql: NONREACTIVE

## 2023-06-20 LAB — CYTOLOGY, (ORAL, ANAL, URETHRAL) ANCILLARY ONLY
Chlamydia: NEGATIVE
Comment: NEGATIVE
Comment: NEGATIVE
Comment: NORMAL
Neisseria Gonorrhea: NEGATIVE
Trichomonas: NEGATIVE
